# Patient Record
Sex: Female | Born: 1955 | Race: White | Hispanic: No | Marital: Married | Smoking: Never smoker
Health system: Southern US, Community
[De-identification: ages and names within clinical notes are randomized; demographics above are authoritative.]

## PROBLEM LIST (undated history)

## (undated) DIAGNOSIS — E785 Hyperlipidemia, unspecified: Secondary | ICD-10-CM

## (undated) DIAGNOSIS — N189 Chronic kidney disease, unspecified: Secondary | ICD-10-CM

## (undated) HISTORY — DX: Chronic kidney disease, unspecified: N18.9

## (undated) HISTORY — PX: CHOLECYSTECTOMY: SHX55

## (undated) HISTORY — PX: DENTAL SURGERY: SHX609

## (undated) HISTORY — PX: RENAL BIOPSY: SHX156

## (undated) HISTORY — DX: Hyperlipidemia, unspecified: E78.5

---

## 1997-02-20 ENCOUNTER — Ambulatory Visit (HOSPITAL_COMMUNITY): Admission: RE | Admit: 1997-02-20 | Discharge: 1997-02-20 | Payer: Self-pay | Admitting: Obstetrics and Gynecology

## 1997-04-13 ENCOUNTER — Ambulatory Visit (HOSPITAL_COMMUNITY): Admission: RE | Admit: 1997-04-13 | Discharge: 1997-04-13 | Payer: Self-pay | Admitting: Nephrology

## 1997-04-17 ENCOUNTER — Ambulatory Visit (HOSPITAL_COMMUNITY): Admission: RE | Admit: 1997-04-17 | Discharge: 1997-04-18 | Payer: Self-pay | Admitting: Nephrology

## 2001-12-22 ENCOUNTER — Other Ambulatory Visit: Admission: RE | Admit: 2001-12-22 | Discharge: 2001-12-22 | Payer: Self-pay | Admitting: Obstetrics and Gynecology

## 2002-10-14 ENCOUNTER — Ambulatory Visit (HOSPITAL_COMMUNITY): Admission: RE | Admit: 2002-10-14 | Discharge: 2002-10-14 | Payer: Self-pay | Admitting: Family Medicine

## 2002-10-14 ENCOUNTER — Encounter: Payer: Self-pay | Admitting: Family Medicine

## 2003-03-07 ENCOUNTER — Other Ambulatory Visit: Admission: RE | Admit: 2003-03-07 | Discharge: 2003-03-07 | Payer: Self-pay | Admitting: Obstetrics and Gynecology

## 2003-05-04 ENCOUNTER — Ambulatory Visit (HOSPITAL_COMMUNITY): Admission: RE | Admit: 2003-05-04 | Discharge: 2003-05-04 | Payer: Self-pay | Admitting: Obstetrics and Gynecology

## 2004-05-31 ENCOUNTER — Other Ambulatory Visit: Admission: RE | Admit: 2004-05-31 | Discharge: 2004-05-31 | Payer: Self-pay | Admitting: Obstetrics and Gynecology

## 2006-04-14 ENCOUNTER — Encounter: Admission: RE | Admit: 2006-04-14 | Discharge: 2006-04-14 | Payer: Self-pay | Admitting: Obstetrics and Gynecology

## 2011-01-09 ENCOUNTER — Other Ambulatory Visit (HOSPITAL_COMMUNITY)
Admission: RE | Admit: 2011-01-09 | Discharge: 2011-01-09 | Disposition: A | Discharge status: Home / Self Care | Payer: 59 | Source: Ambulatory Visit | Attending: Family Medicine | Admitting: Family Medicine

## 2011-01-09 DIAGNOSIS — Z0000 Encounter for general adult medical examination without abnormal findings: Secondary | ICD-10-CM | POA: Insufficient documentation

## 2011-01-10 ENCOUNTER — Other Ambulatory Visit: Payer: Self-pay | Admitting: Family Medicine

## 2011-12-16 ENCOUNTER — Other Ambulatory Visit: Payer: Self-pay | Admitting: Family Medicine

## 2011-12-16 DIAGNOSIS — Z1231 Encounter for screening mammogram for malignant neoplasm of breast: Secondary | ICD-10-CM

## 2012-01-19 ENCOUNTER — Ambulatory Visit
Admission: RE | Admit: 2012-01-19 | Discharge: 2012-01-19 | Disposition: A | Discharge status: Home / Self Care | Payer: 59 | Source: Ambulatory Visit | Attending: Family Medicine | Admitting: Family Medicine

## 2012-01-19 DIAGNOSIS — Z1231 Encounter for screening mammogram for malignant neoplasm of breast: Secondary | ICD-10-CM

## 2012-01-22 ENCOUNTER — Other Ambulatory Visit: Payer: Self-pay | Admitting: Family Medicine

## 2012-01-22 DIAGNOSIS — R928 Other abnormal and inconclusive findings on diagnostic imaging of breast: Secondary | ICD-10-CM

## 2012-02-16 ENCOUNTER — Ambulatory Visit
Admission: RE | Admit: 2012-02-16 | Discharge: 2012-02-16 | Disposition: A | Discharge status: Home / Self Care | Payer: 59 | Source: Ambulatory Visit | Attending: Family Medicine | Admitting: Family Medicine

## 2012-02-16 DIAGNOSIS — R928 Other abnormal and inconclusive findings on diagnostic imaging of breast: Secondary | ICD-10-CM

## 2013-04-04 ENCOUNTER — Ambulatory Visit (INDEPENDENT_AMBULATORY_CARE_PROVIDER_SITE_OTHER): Payer: 59

## 2013-04-04 ENCOUNTER — Encounter: Payer: Self-pay | Admitting: Podiatry

## 2013-04-04 ENCOUNTER — Ambulatory Visit (INDEPENDENT_AMBULATORY_CARE_PROVIDER_SITE_OTHER): Payer: 59 | Admitting: Podiatry

## 2013-04-04 VITALS — BP 122/76 | HR 93 | Resp 16 | Ht 65.5 in | Wt 195.0 lb

## 2013-04-04 DIAGNOSIS — M79609 Pain in unspecified limb: Secondary | ICD-10-CM

## 2013-04-04 DIAGNOSIS — M722 Plantar fascial fibromatosis: Secondary | ICD-10-CM

## 2013-04-04 MED ORDER — TRIAMCINOLONE ACETONIDE 10 MG/ML IJ SUSP
10.0000 mg | Freq: Once | INTRAMUSCULAR | Status: AC
  Administered 2013-04-04: 10 mg

## 2013-04-04 NOTE — Patient Instructions (Signed)

## 2013-04-04 NOTE — Progress Notes (Signed)
   Subjective:    Patient ID: Crystal Bruce, female    DOB: [birth year], 58 y.o.   MRN: [id number]  HPI N heel pain        L Left heel and inferior arch        D 03/31/2013        O after walking the dog, and housework        C shooting pain        A worse after rest and morning        T history of orthotic from [facility], and rest and Tylenol This patient walks 5 days a week 2 miles a day for approximately 8 years.  She is a history of right plantar fasciitis in July 2014 that responded to a Kenalog injection.  Review of Systems  Constitutional: Negative.   HENT: Negative.   Eyes: Negative.   Respiratory: Negative.   Cardiovascular: Negative.   Gastrointestinal: Negative.   Endocrine: Negative.   Genitourinary: Positive for dysuria.  Musculoskeletal: Negative.   Skin: Negative.   Allergic/Immunologic: Negative.   Neurological: Negative.   Hematological: Negative.   Psychiatric/Behavioral: Negative.        Objective:   Physical Exam  Orientated x2 white female  Vascular: DP and PT pulses 2/4 bilaterally  Neurological: Knee and ankle flex equal and reactive bilaterally  Dermatological: Texture and turgor within normal limits  Musculoskeletal palpable tenderness medial plantar left heel and medial navicular area. Patient has a limping gait favoring her left foot. Patient has an existing rigid functional orthotics are 2005 that contour well, which is where  X-ray left foot  Intact bony structure without any fracture and/or dislocation noted. Small inferior calcaneal spur noted  Radiographic impression: No acute bony abnormality noted      Assessment & Plan:   Assessment: Plantar fasciitis left  Plan: The skin is prepped with alcohol and Betadine and 10 mg of Kenalog mixed with 10 mg of plain Xylocaine and 2.5 mg of plain Marcaine are injected into the inferior heel left for Kenalog injection #1  Continue to wear custom orthotics  DC walking and substitute swimming  or exercise bike until fasciitis resolves Reappoint at patient's request

## 2013-04-05 ENCOUNTER — Encounter: Payer: Self-pay | Admitting: Podiatry

## 2013-06-03 ENCOUNTER — Emergency Department (HOSPITAL_COMMUNITY)
Admission: EM | Admit: 2013-06-03 | Discharge: 2013-06-03 | Disposition: A | Discharge status: Home / Self Care | Payer: 59 | Attending: Emergency Medicine | Admitting: Emergency Medicine

## 2013-06-03 ENCOUNTER — Encounter (HOSPITAL_COMMUNITY): Payer: Self-pay | Admitting: Emergency Medicine

## 2013-06-03 ENCOUNTER — Emergency Department (HOSPITAL_COMMUNITY): Payer: 59

## 2013-06-03 DIAGNOSIS — R110 Nausea: Secondary | ICD-10-CM | POA: Insufficient documentation

## 2013-06-03 DIAGNOSIS — N189 Chronic kidney disease, unspecified: Secondary | ICD-10-CM | POA: Insufficient documentation

## 2013-06-03 DIAGNOSIS — M546 Pain in thoracic spine: Secondary | ICD-10-CM | POA: Insufficient documentation

## 2013-06-03 DIAGNOSIS — K7689 Other specified diseases of liver: Secondary | ICD-10-CM | POA: Insufficient documentation

## 2013-06-03 DIAGNOSIS — R0602 Shortness of breath: Secondary | ICD-10-CM | POA: Insufficient documentation

## 2013-06-03 DIAGNOSIS — Z87448 Personal history of other diseases of urinary system: Secondary | ICD-10-CM | POA: Insufficient documentation

## 2013-06-03 DIAGNOSIS — Z79899 Other long term (current) drug therapy: Secondary | ICD-10-CM | POA: Insufficient documentation

## 2013-06-03 DIAGNOSIS — E785 Hyperlipidemia, unspecified: Secondary | ICD-10-CM | POA: Insufficient documentation

## 2013-06-03 DIAGNOSIS — R52 Pain, unspecified: Secondary | ICD-10-CM | POA: Insufficient documentation

## 2013-06-03 LAB — CBC
HEMATOCRIT: 39.5 % (ref 36.0–46.0)
Hemoglobin: 12.3 g/dL (ref 12.0–15.0)
MCH: 27.7 pg (ref 26.0–34.0)
MCHC: 31.1 g/dL (ref 30.0–36.0)
MCV: 89 fL (ref 78.0–100.0)
Platelets: 187 10*3/uL (ref 150–400)
RBC: 4.44 MIL/uL (ref 3.87–5.11)
RDW: 13.6 % (ref 11.5–15.5)
WBC: 6.8 10*3/uL (ref 4.0–10.5)

## 2013-06-03 LAB — BASIC METABOLIC PANEL
BUN: 21 mg/dL (ref 6–23)
CALCIUM: 9.6 mg/dL (ref 8.4–10.5)
CHLORIDE: 100 meq/L (ref 96–112)
CO2: 28 meq/L (ref 19–32)
CREATININE: 1.06 mg/dL (ref 0.50–1.10)
GFR calc Af Amer: 67 mL/min — ABNORMAL LOW (ref >90)
GFR calc non Af Amer: 58 mL/min — ABNORMAL LOW (ref >90)
GLUCOSE: 113 mg/dL — AB (ref 70–99)
Potassium: 4.5 mEq/L (ref 3.7–5.3)
Sodium: 140 mEq/L (ref 137–147)

## 2013-06-03 LAB — URINALYSIS, ROUTINE W REFLEX MICROSCOPIC
BILIRUBIN URINE: NEGATIVE
GLUCOSE, UA: NEGATIVE mg/dL
HGB URINE DIPSTICK: NEGATIVE
Ketones, ur: NEGATIVE mg/dL
Nitrite: NEGATIVE
PROTEIN: NEGATIVE mg/dL
Specific Gravity, Urine: 1.019 (ref 1.005–1.030)
Urobilinogen, UA: 0.2 mg/dL (ref 0.0–1.0)
pH: 6 (ref 5.0–8.0)

## 2013-06-03 LAB — COMPREHENSIVE METABOLIC PANEL
ALK PHOS: 78 U/L (ref 39–117)
ALT: 32 U/L (ref 0–35)
AST: 29 U/L (ref 0–37)
Albumin: 3.9 g/dL (ref 3.5–5.2)
BILIRUBIN TOTAL: 0.4 mg/dL (ref 0.3–1.2)
BUN: 19 mg/dL (ref 6–23)
CHLORIDE: 105 meq/L (ref 96–112)
CO2: 26 meq/L (ref 19–32)
Calcium: 9.5 mg/dL (ref 8.4–10.5)
Creatinine, Ser: 1.06 mg/dL (ref 0.50–1.10)
GFR calc non Af Amer: 58 mL/min — ABNORMAL LOW (ref >90)
GFR, EST AFRICAN AMERICAN: 67 mL/min — AB (ref >90)
GLUCOSE: 98 mg/dL (ref 70–99)
POTASSIUM: 4.3 meq/L (ref 3.7–5.3)
SODIUM: 145 meq/L (ref 137–147)
TOTAL PROTEIN: 7 g/dL (ref 6.0–8.3)

## 2013-06-03 LAB — URINE MICROSCOPIC-ADD ON

## 2013-06-03 LAB — I-STAT TROPONIN, ED: Troponin i, poc: 0 ng/mL (ref 0.00–0.08)

## 2013-06-03 LAB — PRO B NATRIURETIC PEPTIDE: Pro B Natriuretic peptide (BNP): 43.5 pg/mL (ref 0–125)

## 2013-06-03 LAB — LIPASE, BLOOD: Lipase: 26 U/L (ref 11–59)

## 2013-06-03 LAB — TROPONIN I: Troponin I: <0.3 ng/mL (ref <0.30)

## 2013-06-03 MED ORDER — IOHEXOL 350 MG/ML SOLN
100.0000 mL | Freq: Once | INTRAVENOUS | Status: AC | PRN
  Administered 2013-06-03: 100 mL via INTRAVENOUS

## 2013-06-03 MED ORDER — DIPHENHYDRAMINE HCL 50 MG/ML IJ SOLN
50.0000 mg | Freq: Once | INTRAMUSCULAR | Status: AC
  Administered 2013-06-03: 50 mg via INTRAVENOUS
  Filled 2013-06-03: qty 1

## 2013-06-03 MED ORDER — OXYCODONE-ACETAMINOPHEN 5-325 MG PO TABS: 1.0000 | ORAL_TABLET | ORAL | Status: AC | PRN

## 2013-06-03 MED ORDER — METHYLPREDNISOLONE SODIUM SUCC 125 MG IJ SOLR
125.0000 mg | Freq: Once | INTRAMUSCULAR | Status: AC
  Administered 2013-06-03: 125 mg via INTRAVENOUS
  Filled 2013-06-03: qty 2

## 2013-06-03 MED ORDER — SODIUM CHLORIDE 0.9 % IV BOLUS (SEPSIS)
1000.0000 mL | Freq: Once | INTRAVENOUS | Status: AC
  Administered 2013-06-03: 1000 mL via INTRAVENOUS

## 2013-06-03 MED ORDER — HYDROMORPHONE HCL PF 1 MG/ML IJ SOLN
1.0000 mg | Freq: Once | INTRAMUSCULAR | Status: AC
  Administered 2013-06-03: 1 mg via INTRAVENOUS
  Filled 2013-06-03: qty 1

## 2013-06-03 NOTE — ED Notes (Signed)
Pt reports sudden on set of sharp back pain between shoulder blades, nausea, shortness of breath approx 30 minutes ago. No heart hx.

## 2013-06-03 NOTE — Discharge Instructions (Signed)
The pain you are experiencing appears to be muscular in origin.  If your symptoms worsen such as worsening pain, fever, or vomiting, please return for further evaluation.

## 2013-06-03 NOTE — ED Provider Notes (Addendum)
Patient not in room.  Hilario Quarry, MD 06/03/13 1009  Hilario Quarry, MD 06/03/13 [phone]

## 2013-06-03 NOTE — ED Provider Notes (Signed)
CSN: [id number]     Arrival date & time 06/03/13  0844 History   First MD Initiated Contact with Patient 06/03/13 1001     Chief Complaint  Patient presents with  . Back Pain  . Shortness of Breath  . Nausea     (Consider location/radiation/quality/duration/timing/severity/associated sxs/prior Treatment) HPI 58 y.o female with right upper back pain below scapula which began this am and worsened.  She states it feels worth with deep breathing and movement.  The pain is sharp.  She has not had this pain in the past.  The pain has been ongoing for several hours.   She has some shortness of breath which appears to be associated with pain.  No cough, fever, or chill. No anterior pain and no radiation to arm, neck, or shoulder.  Past Medical History  Diagnosis Date  . Hyperlipidemia   . Chronic kidney disease     focal segmental glomerulosis schlerosis   Past Surgical History  Procedure Laterality Date  . Dental surgery    . Cholecystectomy    . Renal biopsy     Family History  Problem Relation Age of Onset  . Pulmonary embolism Mother   . Hyperlipidemia Father   . Cancer Father    History  Substance Use Topics  . Smoking status: Never Smoker   . Smokeless tobacco: Not on file  . Alcohol Use: No   OB History   Grav Para Term Preterm Abortions TAB SAB Ect Mult Living                 Review of Systems  All other systems reviewed and are negative.     Allergies  Ibuprofen; Ivp dye; and Naproxen  Home Medications   Prior to Admission medications   Medication Sig Start Date End Date Taking? Authorizing Provider  co-enzyme Q-10 30 MG capsule Take 30 mg by mouth daily.    Yes Historical Provider, MD  fexofenadine (ALLEGRA) 180 MG tablet Take 180 mg by mouth daily.   Yes Historical Provider, MD  losartan (COZAAR) 100 MG tablet Take 100 mg by mouth daily.   Yes Historical Provider, MD  Multiple Vitamin (MULTIVITAMIN) capsule Take 1 capsule by mouth daily.   Yes Historical  Provider, MD  Omega-3 Fatty Acids (FISH OIL) 1000 MG CAPS Take 1,000 mg by mouth daily.    Yes Historical Provider, MD  pravastatin (PRAVACHOL) 80 MG tablet Take 80 mg by mouth daily.   Yes Historical Provider, MD  oxyCODONE-acetaminophen (PERCOCET/ROXICET) 5-325 MG per tablet Take 1 tablet by mouth every 4 (four) hours as needed for severe pain. 06/03/13   Hilario Quarry, MD   BP 114/61  Pulse 73  Temp(Src) 97.8 F (36.6 C) (Oral)  Resp 15  SpO2 97% Physical Exam  Nursing note and vitals reviewed. Constitutional: She is oriented to person, place, and time. She appears well-developed and well-nourished.  HENT:  Head: Normocephalic and atraumatic.  Right Ear: External ear normal.  Left Ear: External ear normal.  Nose: Nose normal.  Mouth/Throat: Oropharynx is clear and moist.  Eyes: Conjunctivae and EOM are normal. Pupils are equal, round, and reactive to light.  Neck: Normal range of motion. Neck supple.  Cardiovascular: Normal rate, regular rhythm, normal heart sounds and intact distal pulses.   Pulmonary/Chest: Effort normal and breath sounds normal.  Abdominal: Soft. Bowel sounds are normal.  Musculoskeletal: Normal range of motion.  Right upper back ttp, no crepitus  Neurological: She is alert and oriented to  person, place, and time. She has normal reflexes.  Skin: Skin is warm and dry.  Psychiatric: She has a normal mood and affect. Her behavior is normal. Thought content normal.    ED Course  Procedures (including critical care time) Labs Review Labs Reviewed  BASIC METABOLIC PANEL - Abnormal; Notable for the following:    Glucose, Bld 113 (*)    GFR calc non Af Amer 58 (*)    GFR calc Af Amer 67 (*)    All other components within normal limits  COMPREHENSIVE METABOLIC PANEL - Abnormal; Notable for the following:    GFR calc non Af Amer 58 (*)    GFR calc Af Amer 67 (*)    All other components within normal limits  URINALYSIS, ROUTINE W REFLEX MICROSCOPIC - Abnormal;  Notable for the following:    Leukocytes, UA SMALL (*)    All other components within normal limits  CBC  PRO B NATRIURETIC PEPTIDE  TROPONIN I  LIPASE, BLOOD  URINE MICROSCOPIC-ADD ON  Rosezena Sensor, ED    Imaging Review Dg Chest 2 View  06/03/2013   CLINICAL DATA:  Right-sided pleuritic chest pain. Shortness of breath. Nausea.  EXAM: CHEST  2 VIEW  COMPARISON:  None.  FINDINGS: The heart size and mediastinal contours are within normal limits. Both lungs are clear. The visualized skeletal structures are unremarkable.  IMPRESSION: No active cardiopulmonary disease.   Electronically Signed   By: Myles Rosenthal M.D.   On: 06/03/2013 10:27   Ct Angio Chest Pe W/cm &/or Wo Cm  06/03/2013   CLINICAL DATA:  Short of breath and back pain between shoulder blades. Nausea. Pleuritic right upper back pain.  EXAM: CT ANGIOGRAPHY CHEST  CT ABDOMEN AND PELVIS WITH CONTRAST  TECHNIQUE: Multidetector CT imaging of the chest was performed using the standard protocol during bolus administration of intravenous contrast. Multiplanar CT image reconstructions and MIPs were obtained to evaluate the vascular anatomy. Multidetector CT imaging of the abdomen and pelvis was performed using the standard protocol during bolus administration of intravenous contrast.  CONTRAST:  OMNIPAQUE IOHEXOL 350 MG/ML SOLN  COMPARISON:  Plain films 06/03/2013 chest.  No prior CTs.  FINDINGS: CT CHEST FINDINGS  Lungs/Pleura: Minimal motion degradation. Bibasilar dependent subsegmental atelectasis with volume loss.  No pleural fluid.  Heart/Mediastinum: The quality of this examination for evaluation of pulmonary embolism is good. Bolus is well timed. No evidence of pulmonary embolism.  Normal aortic caliber without dissection.  Mild cardiomegaly, without pericardial effusion. No mediastinal or hilar adenopathy.  CT ABDOMEN AND PELVIS FINDINGS  Abdomen/Pelvis: Mild hepatic steatosis. No focal liver lesion. Normal spleen, stomach, pancreas.  Cholecystectomy, without biliary ductal dilatation. Normal adrenal glands. Mild renal cortical atrophy bilaterally. Bilateral extrarenal pelvis. Mild calyceal prominence is felt to be related to the extent of urinary bladder distention. No retroperitoneal or retrocrural adenopathy. Normal colon, appendix, and terminal ileum. Normal small bowel without abdominal ascites.  No pelvic adenopathy. Mild urinary bladder distention. Normal uterus, without adnexal mass or significant free pelvic fluid.  Bones/Musculoskeletal: No acute osseous abnormality.  Review of the MIP images confirms the above findings.  IMPRESSION: 1. No evidence of pulmonary embolism or aortic dissection. Minimal motion degradation within the chest. 2.  No acute process in the abdomen or pelvis. 3. Hepatic steatosis.   Electronically Signed   By: Jeronimo Greaves M.D.   On: 06/03/2013 14:10   Ct Abdomen Pelvis W Contrast  06/03/2013   CLINICAL DATA:  Short of breath  and back pain between shoulder blades. Nausea. Pleuritic right upper back pain.  EXAM: CT ANGIOGRAPHY CHEST  CT ABDOMEN AND PELVIS WITH CONTRAST  TECHNIQUE: Multidetector CT imaging of the chest was performed using the standard protocol during bolus administration of intravenous contrast. Multiplanar CT image reconstructions and MIPs were obtained to evaluate the vascular anatomy. Multidetector CT imaging of the abdomen and pelvis was performed using the standard protocol during bolus administration of intravenous contrast.  CONTRAST:  100mL OMNIPAQUE IOHEXOL 350 MG/ML SOLN  COMPARISON:  Plain films 06/03/2013 chest.  No prior CTs.  FINDINGS: CT CHEST FINDINGS  Lungs/Pleura: Minimal motion degradation. Bibasilar dependent subsegmental atelectasis with volume loss.  No pleural fluid.  Heart/Mediastinum: The quality of this examination for evaluation of pulmonary embolism is good. Bolus is well timed. No evidence of pulmonary embolism.  Normal aortic caliber without dissection.  Mild  cardiomegaly, without pericardial effusion. No mediastinal or hilar adenopathy.  CT ABDOMEN AND PELVIS FINDINGS  Abdomen/Pelvis: Mild hepatic steatosis. No focal liver lesion. Normal spleen, stomach, pancreas. Cholecystectomy, without biliary ductal dilatation. Normal adrenal glands. Mild renal cortical atrophy bilaterally. Bilateral extrarenal pelvis. Mild calyceal prominence is felt to be related to the extent of urinary bladder distention. No retroperitoneal or retrocrural adenopathy. Normal colon, appendix, and terminal ileum. Normal small bowel without abdominal ascites.  No pelvic adenopathy. Mild urinary bladder distention. Normal uterus, without adnexal mass or significant free pelvic fluid.  Bones/Musculoskeletal: No acute osseous abnormality.  Review of the MIP images confirms the above findings.  IMPRESSION: 1. No evidence of pulmonary embolism or aortic dissection. Minimal motion degradation within the chest. 2.  No acute process in the abdomen or pelvis. 3. Hepatic steatosis.   Electronically Signed   By: Jeronimo GreavesKyle  Talbot M.D.   On: 06/03/2013 14:10     EKG Interpretation   Date/Time:  Friday Jun 03 2013 08:51:08 EDT Ventricular Rate:  67 PR Interval:  124 QRS Duration: 72 QT Interval:  384 QTC Calculation: 405 R Axis:   42 Text Interpretation:  Normal sinus rhythm Nonspecific ST abnormality  Abnormal ECG Confirmed by Waymond Meador MD, Duwayne HeckANIELLE ([id number]) on 06/03/2013 3:55:39 PM      MDM   Final diagnoses:  Acute thoracic back pain    Patient with history of iv contrast reaction in her teens and renal insufficiency.  Patient with some ruq ttp and chest symptoms.  She was evaluated for cardiac etiology, pe, and great vessel disease.  CT angio and ct abdomen without acute abnormality.  Patient feels improved and given return precautions and advise regarding follow up.  She voices understanding.     Hilario Quarryanielle S Marlyn Rabine, MD 06/03/13 [phone]

## 2013-06-03 NOTE — ED Notes (Signed)
Pt denies complaints at this time. Pt alert x4 respirations easy non labored.

## 2013-06-03 NOTE — ED Notes (Signed)
Pt return from ct scan 

## 2013-10-18 ENCOUNTER — Other Ambulatory Visit (HOSPITAL_COMMUNITY)
Admission: RE | Admit: 2013-10-18 | Discharge: 2013-10-18 | Disposition: A | Discharge status: Home / Self Care | Payer: 59 | Source: Ambulatory Visit | Attending: Family Medicine | Admitting: Family Medicine

## 2013-10-18 ENCOUNTER — Other Ambulatory Visit: Payer: Self-pay | Admitting: Family Medicine

## 2013-10-18 DIAGNOSIS — Z124 Encounter for screening for malignant neoplasm of cervix: Secondary | ICD-10-CM | POA: Diagnosis present

## 2013-10-20 LAB — CYTOLOGY - PAP

## 2015-07-17 ENCOUNTER — Other Ambulatory Visit: Payer: Self-pay | Admitting: Family Medicine

## 2015-07-17 DIAGNOSIS — Z1231 Encounter for screening mammogram for malignant neoplasm of breast: Secondary | ICD-10-CM

## 2015-07-26 ENCOUNTER — Ambulatory Visit
Admission: RE | Admit: 2015-07-26 | Discharge: 2015-07-26 | Disposition: A | Discharge status: Home / Self Care | Payer: 59 | Source: Ambulatory Visit | Attending: Family Medicine | Admitting: Family Medicine

## 2015-07-26 DIAGNOSIS — Z1231 Encounter for screening mammogram for malignant neoplasm of breast: Secondary | ICD-10-CM

## 2016-07-25 ENCOUNTER — Other Ambulatory Visit: Payer: Self-pay | Admitting: Family Medicine

## 2016-07-25 ENCOUNTER — Other Ambulatory Visit (HOSPITAL_COMMUNITY)
Admission: RE | Admit: 2016-07-25 | Discharge: 2016-07-25 | Disposition: A | Discharge status: Home / Self Care | Payer: BLUE CROSS/BLUE SHIELD | Source: Ambulatory Visit | Attending: Family Medicine | Admitting: Family Medicine

## 2016-07-25 DIAGNOSIS — Z124 Encounter for screening for malignant neoplasm of cervix: Secondary | ICD-10-CM | POA: Diagnosis present

## 2016-07-28 LAB — CYTOLOGY - PAP: Diagnosis: NEGATIVE

## 2016-10-31 ENCOUNTER — Other Ambulatory Visit: Payer: Self-pay | Admitting: Family Medicine

## 2016-10-31 DIAGNOSIS — Z1231 Encounter for screening mammogram for malignant neoplasm of breast: Secondary | ICD-10-CM

## 2016-11-03 ENCOUNTER — Ambulatory Visit
Admission: RE | Admit: 2016-11-03 | Discharge: 2016-11-03 | Disposition: A | Discharge status: Home / Self Care | Payer: BLUE CROSS/BLUE SHIELD | Source: Ambulatory Visit | Attending: Family Medicine | Admitting: Family Medicine

## 2016-11-03 DIAGNOSIS — Z1231 Encounter for screening mammogram for malignant neoplasm of breast: Secondary | ICD-10-CM

## 2016-11-05 NOTE — Progress Notes (Signed)
[facility]   Patient Details  Name: Crystal BouquetSusan Buchheit MRN: [id number] Date of Birth: May 08, 1955 Age: 61 y.o. PCP: Laurann MontanaWhite, Cynthia, MD  Vitals:   11/05/16 1329  BP: 120/78  Pulse: 78  Resp: 18  SpO2: 98%  Weight: 195 lb 12.8 oz (88.8 kg)         [facility] Eval - 11/05/16 1300      Referral    Referring Provider self   Reason for referral Obesitity/Overweight;High Cholesterol;Orthopedic   Program Start Date 11/05/16     Measurement   Waist Circumference 42 inches   Hip Circumference 48 inches   Body fat 45.1 percent     Information for Trainer   Goals "stability/balance, endurance,weight loss, to enjoy exercising"   Current Exercise "walk dog, fitboard"(I typically hit my 10,000step goal"   Orthopedic Concerns Back issues over past 20 yrs, knees occasionally"talk" to me"   Pertinent Medical History back   Current Barriers none     Timed Up and Go (TUGS)   Timed Up and Go Low risk <9 seconds  7.42     Mobility and Daily Activities   I find it easy to walk up or down two or more flights of stairs. 3   I have no trouble taking out the trash. 4   I do housework such as vacuuming and dusting on my own without difficulty. 3   I can easily lift a gallon of milk (8lbs). 4   I can easily walk a mile. 4   I have no trouble reaching into high cupboards or reaching down to pick up something from the floor. 3   I do not have trouble doing out-door work such as Loss adjuster, charteredmoving the lawn, raking leaves, or gardening. 3     Mobility and Daily Activities   I feel younger than my age. 2   I feel independent. 4   I feel energetic. 3   I live an active life.  2   I feel strong. 2   I feel healthy. 2   I feel active as other people my age. 2     How fit and strong are you.   Fit and Strong Total Score 41     Past Medical History:  Diagnosis Date  . Chronic kidney disease    focal segmental glomerulosis schlerosis  . Hyperlipidemia    Past Surgical History:   Procedure Laterality Date  . CHOLECYSTECTOMY    . DENTAL SURGERY    . RENAL BIOPSY     History  Smoking Status  . Never Smoker  Smokeless Tobacco  . Not on file     Ms. Tillman AbideSlagle has registered for the PREP and attended her first weekly class today.  She is motivated to make healthy lifestyle change.   Rose FillersDebbie Texie Tupou 11/05/2016, 1:34 PM

## 2016-11-14 NOTE — Progress Notes (Signed)
[facility]   Patient Details  Name: Crystal Bruce MRN: [id number] Date of Birth: 12-11-1955 Age: 61 y.o. PCP: Laurann MontanaWhite, Cynthia, MD  Vitals:   11/14/16 1331  Weight: 194 lb 6.4 oz (88.2 kg)    [facility] Weekly seesion - 11/14/16 1300      Weekly Session   Topic Discussed  Water    Minutes exercised this week  -- 15 min q/am flexibility & about 10, 000 steps Q day   15 min q/am flexibility & about 10, 000 steps Q day   Classes attended to date  2      Fun things you did since last meeting:"reconnected w/several friends" Things you are grateful for:"family" Nutrition celebrations:"didn't eat a lot of halloween candy" Barriers:"worked on removing barriers this week.  Cleaned out pantry and fridge"  Rose FillersDebbie Jaziyah Gradel 11/14/2016, 1:33 PM

## 2016-12-05 NOTE — Progress Notes (Signed)
[facility]   Patient Details  Name: Crystal BouquetSusan Shima MRN: [id number] Date of Birth: June 27, 1955 Age: 61 y.o. PCP: Laurann MontanaWhite, Cynthia, MD  Vitals:   11/19/16 1213  Weight: 193 lb 6.4 oz (87.7 kg)    [facility] Weekly seesion - 12/05/16 1200      Weekly Session   Topic Discussed  Eating for the season    Minutes exercised this week  0 minutes    Classes attended to date  3      Fun things you did this week:"[place]" Things you are grateful for:"Family-Jim continues to be a trememdous help w/mom" Nutrition celebrations:"this was my 4th week of weight waters-total lost 3.8lbs" Barriers:"back issues"   Rose FillersDebbie Raynaldo Falco 12/05/2016, 12:13 PM

## 2016-12-05 NOTE — Progress Notes (Signed)
[facility]   Patient Details  Name: Crystal BouquetSusan Bruce MRN: [id number] Date of Birth: 08/24/1955 Age: 61 y.o. PCP: Laurann MontanaWhite, Cynthia, MD  Vitals:   12/03/16 1248  Weight: 193 lb (87.5 kg)    [facility] Weekly seesion - 12/05/16 1248      Weekly Session   Topic Discussed  Other portions   portions   Minutes exercised this week  0 minutes "starting w/trainer next week"   "starting w/trainer next week"   Classes attended to date  4      Fun things you did last week:"hanging out /wfamily" Things you are grateful for:"heat" Nutrition celebrations:"didn't go back for seconds at Thanksgiving" Barriers:"[place] [facility] but parking lot was so congested I left"  Rose FillersDebbie Ardyn Forge 12/05/2016, 12:49 PM

## 2016-12-12 NOTE — Progress Notes (Signed)
[facility]   Patient Details  Name: Virgel BouquetSusan Backlund MRN: [id number] Date of Birth: 1955-11-04 Age: 61 y.o. PCP: Laurann MontanaWhite, Cynthia, MD  Vitals:   12/10/16 1237  Weight: 189 lb 12.8 oz (86.1 kg)    [facility] Weekly seesion - 12/12/16 1200      Weekly Session   Topic Discussed  Hitting roadblocks    Minutes exercised this week  30 minutes cardio   cardio   Classes attended to date  5      Fun things you did since last meeting:"Knit-almost finished a sweater" Things you are grateful for:"Health care for now" Nutrition celebrations for the week:"Tracked food-stayed w/i points range" Barriers:"myself"  Rose FillersDebbie Besan Ketchem 12/12/2016, 12:37 PM

## 2016-12-26 NOTE — Progress Notes (Signed)
[facility]   Patient Details  Name: Virgel BouquetSusan Fenstermaker MRN: [id number] Date of Birth: January 02, 1956 Age: 61 y.o. PCP: Laurann MontanaWhite, Cynthia, MD  Vitals:   12/26/16 1051  Weight: 188 lb 6.4 oz (85.5 kg)    [facility] Weekly seesion - 12/26/16 1000      Weekly Session   Topic Discussed  Expectations and non-scale victories    Minutes exercised this week  60 minutes 30cardio/30strength   30cardio/30strength   Classes attended to date  6      Fun things you did since last meeting:"picked up the boy from [employer]" Things you are grateful for:"having Daylon home also having my brother take mom to dr. Burgess EstelleYesterday." Nutrition celebrations:"continued to track" Barriers:"mom w/her dementia"   Rose FillersDebbie Romaine Maciolek 12/26/2016, 10:59 AM

## 2017-01-09 NOTE — Progress Notes (Signed)
[facility]   Patient Details  Name: Crystal BouquetSusan Bruce MRN: [id number] Date of Birth: 04-08-1955 Age: 62 y.o. PCP: Laurann MontanaWhite, Cynthia, MD  Vitals:   01/07/17 0957  Weight: 186 lb 9.6 oz (84.6 kg)    [facility] Weekly seesion - 01/09/17 0900      Weekly Session   Topic Discussed  Restaurant Eating    Minutes exercised this week  75 minutes "30+cardio/45+strength"   "30+cardio/45+strength"   Classes attended to date  7      Fun things you did since last meeting:"reconnected w/girlfriends on 1/1" Things you are grateful for:"family" Nutrition celebrations for the week""didn't go junk food crazy over the holidays" Barriers:"myself"   Rose FillersDebbie Maleigh Bagot 01/09/2017, 10:10 AM

## 2017-01-19 NOTE — Progress Notes (Signed)
[facility]   Patient Details  Name: Virgel BouquetSusan Tapscott MRN: [id number] Date of Birth: 1955/02/19 Age: 62 y.o. PCP: Laurann MontanaWhite, Cynthia, MD  Vitals:   01/19/17 [id number]  Weight: 188 lb 12.8 oz (85.6 kg)    [facility] Weekly seesion - 01/19/17 0900      Weekly Session   Topic Discussed  Stress management and problem solving    Minutes exercised this week  60 minutes 30cardio/30strength   30cardio/30strength   Classes attended to date  8      Fun things you did since last meeting:"read"   Rose FillersDebbie Honest Safranek 01/19/2017, 9:24 AM

## 2017-01-26 NOTE — Progress Notes (Signed)
[facility]   Patient Details  Name: Crystal BouquetSusan Bruce MRN: [id number] Date of Birth: [birth year] Age: 62 y.o. PCP: Laurann MontanaWhite, Cynthia, MD  Vitals:   01/21/17 1127  Weight: 190 lb 9.6 oz (86.5 kg)    [facility] Weekly seesion - 01/26/17 1100      Weekly Session   Topic Discussed  Importance of resistance training    Minutes exercised this week  150 minutes 90cardio/30strength   90cardio/30strength   Classes attended to date  569      Fun things you did since last meeting:"Had some friends over to watch Donnella BiMama Mia, Here We Go Again" Things you are grateful for:"Friends" Nutrition celebrations:"All junk is out of the house.  Back to planning healthier choices" Barriers:"Demanding Mother"   Rose FillersDebbie Inari Shin 01/26/2017, 11:29 AM

## 2017-02-04 NOTE — Progress Notes (Signed)
[facility] PRE[facility] Weekly Session   Patient Details  Name: Crystal Bruce MRN: [id number] Date of Birth: 01/28/1955 Age: 62 y.o. PCP: Harlan Stains, MD  Vitals:   02/04/17 1257  Weight: 189 lb 8 oz (86 kg)    [facility] Weekly seesion - 02/04/17 1200      Weekly Session   Topic Discussed  Other portion control   portion control   Minutes exercised this week  -- "cardio"   "cardio"   Classes attended to date  36      Fun things you did since last meeting:"Met w/knitting group and went to engagement party" Things you are grateful for:"warm jackets and scarves" Nutrition celebrations:"tracked food intake" Barriers:" fell last Friday when carrying dog down steps. Big toe is still swollen, bruised and tender"   [name] 02/04/2017, 12:58 PM

## 2017-02-04 NOTE — Progress Notes (Signed)
[facility]   Patient Details  Name: Crystal BouquetSusan Bruce MRN: [id number] Date of Birth: 05-22-1955 Age: 62 y.o. PCP: Laurann MontanaWhite, Cynthia, MD  Vitals:   01/28/17 0913  Weight: 189 lb 9.6 oz (86 kg)    [facility] Weekly seesion - 02/04/17 0900      Weekly Session   Topic Discussed  Finding support    Minutes exercised this week  120 minutes 60cardio/60strength + home workouts   60cardio/60strength + home workouts   Classes attended to date  10      Accomplishments since last meeting:"1)took down tree and put decorations away" 2)Go Jim to vaccuum while I put stuff away" Things you are grateful for:"celebrated brothers, B-day @ my house" Nutrition celebrations:"Fed B-day guests healthy choices (chicken picante, squash, carrots, rice)" Barriers:"Clutter. Next Goal!  Get Rosanne AshingJim to empty canister after he vaccuums"  Rose FillersDebbie Aerianna Losey 02/04/2017, 9:14 AM

## 2017-02-13 NOTE — Progress Notes (Signed)
[facility]   Patient Details  Name: Crystal BouquetSusan Bruce MRN: [id number] Date of Birth: [birth year] Age: 62 y.o. PCP: Laurann MontanaWhite, Cynthia, MD  Vitals:   02/11/17 1349  Weight: 189 lb (85.7 kg)    [facility] Weekly seesion - 02/13/17 1300      Weekly Session   Topic Discussed  Other Guest speaker   Guest speaker   Classes attended to date  4212      Fun things you did since last meeting:"Encourated dog to do our old walking route" Things you are grateful for:"Warm weather and fitting in size 16 jeans without laying down to zip"  Rose FillersDebbie Aleaha Fickling 02/13/2017, 1:49 PM

## 2017-02-20 NOTE — Progress Notes (Signed)
[facility]   Patient Details  Name: Crystal Bruce MRN: [id number] Date of Birth: 11-Sep-1955 Age: 62 y.o. PCP: Laurann MontanaWhite, Cynthia, MD  Vitals:   02/18/17 1034  Weight: 186 lb 14.4 oz (84.8 kg)    [facility] Weekly seesion - 02/20/17 1000      Weekly Session   Topic Discussed  Healthy eating tips    Classes attended to date  9313      Fun things you did since last meeting:"Lunch w/friends" Things you are grateful for:"this program" Nutrition celebrations:"Tracked daily"  Rose FillersDebbie Morine Kohlman 02/20/2017, 10:35 AM

## 2017-03-02 NOTE — Progress Notes (Signed)
[facility] PRE[facility] Progress Report   Patient Details  Name: Crystal Bruce MRN: [id number] Date of Birth: 03/09/1955 Age: 62 y.o. PCP: Crystal Bruce, Cynthia, MD  Vitals:   03/02/17 1346  BP: 110/74  Pulse: 77  Resp: 18  SpO2: 97%  Weight: 188 lb 9.6 oz (85.5 kg)     Crystal Bruce - 03/02/17 1300      Measurement   Waist Circumference  41 inches    Hip Circumference  44 inches      Mobility and Daily Activities   I find it easy to walk up or down two or more flights of stairs.  3    I have no trouble taking out the trash.  4    I do housework such as vacuuming and dusting on my own without difficulty.  4    I can easily lift a gallon of milk (8lbs).  4    I can easily walk a mile.  4    I have no trouble reaching into high cupboards or reaching down to pick up something from the floor.  4    I do not have trouble doing out-door work such as Loss adjuster, charteredmoving the lawn, raking leaves, or gardening.  4      Mobility and Daily Activities   I feel younger than my age.  3    I feel independent.  4    I feel energetic.  3    I live an active life.   3    I feel strong.  4    I feel healthy.  4    I feel active as other people my age.  4      How fit and strong are you.   Fit and Strong Total Score  52      Past Medical History:  Diagnosis Date  . Chronic kidney disease    focal segmental glomerulosis schlerosis  . Hyperlipidemia    Past Surgical History:  Procedure Laterality Date  . CHOLECYSTECTOMY    . DENTAL SURGERY    . RENAL BIOPSY     Social History   Tobacco Use  Smoking Status Never Smoker    Some of Crystal Bruce's goals for the program when she started were to build stability, balance, and endurance. She also wanted to lose weight and enjoy exercising.  She states she has much better balance and stability and enjoys her exercise routine.  She would like to increase her cardio exercise to increase her endurance, and plans to do that.  She states she is more active, more often  throughout her day, whereas previously she'd reach her [employer] step goal then sit around the rest of the day.  She also states her knees don't seem to bother her as much as they used to and that she recovers much better than she used to.  She doesn't need to use her hands to get up from a chair and can get up off the floor much easier than prior to starting the program.   Through the program and her enrollment with [employer], Crystal Bruce has decreased her weight, BMI, and Body Fat % and increased her fitness level.  She plans to continue to exercise, and in fact, states she'd like to add an extra workout day a week to her current routine.    Crystal FillersDebbie Crystal Bruce 03/02/2017, 1:47 PM

## 2017-03-30 ENCOUNTER — Ambulatory Visit (INDEPENDENT_AMBULATORY_CARE_PROVIDER_SITE_OTHER): Payer: BLUE CROSS/BLUE SHIELD | Admitting: Licensed Clinical Social Worker

## 2017-03-30 DIAGNOSIS — F321 Major depressive disorder, single episode, moderate: Secondary | ICD-10-CM

## 2017-04-09 ENCOUNTER — Ambulatory Visit (INDEPENDENT_AMBULATORY_CARE_PROVIDER_SITE_OTHER): Payer: BLUE CROSS/BLUE SHIELD | Admitting: Licensed Clinical Social Worker

## 2017-04-09 DIAGNOSIS — F3341 Major depressive disorder, recurrent, in partial remission: Secondary | ICD-10-CM | POA: Diagnosis not present

## 2017-05-05 ENCOUNTER — Ambulatory Visit (INDEPENDENT_AMBULATORY_CARE_PROVIDER_SITE_OTHER): Payer: BLUE CROSS/BLUE SHIELD | Admitting: Licensed Clinical Social Worker

## 2017-05-05 DIAGNOSIS — F3341 Major depressive disorder, recurrent, in partial remission: Secondary | ICD-10-CM

## 2017-05-19 ENCOUNTER — Ambulatory Visit (INDEPENDENT_AMBULATORY_CARE_PROVIDER_SITE_OTHER): Payer: BLUE CROSS/BLUE SHIELD | Admitting: Licensed Clinical Social Worker

## 2017-05-19 DIAGNOSIS — F3341 Major depressive disorder, recurrent, in partial remission: Secondary | ICD-10-CM

## 2017-06-02 ENCOUNTER — Ambulatory Visit (INDEPENDENT_AMBULATORY_CARE_PROVIDER_SITE_OTHER): Payer: BLUE CROSS/BLUE SHIELD | Admitting: Licensed Clinical Social Worker

## 2017-06-02 DIAGNOSIS — F3341 Major depressive disorder, recurrent, in partial remission: Secondary | ICD-10-CM

## 2017-06-16 ENCOUNTER — Ambulatory Visit: Payer: BLUE CROSS/BLUE SHIELD | Admitting: Licensed Clinical Social Worker

## 2017-06-30 ENCOUNTER — Ambulatory Visit (INDEPENDENT_AMBULATORY_CARE_PROVIDER_SITE_OTHER): Payer: BLUE CROSS/BLUE SHIELD | Admitting: Licensed Clinical Social Worker

## 2017-06-30 DIAGNOSIS — F3341 Major depressive disorder, recurrent, in partial remission: Secondary | ICD-10-CM | POA: Diagnosis not present

## 2017-07-27 ENCOUNTER — Ambulatory Visit (INDEPENDENT_AMBULATORY_CARE_PROVIDER_SITE_OTHER): Payer: BLUE CROSS/BLUE SHIELD | Admitting: Licensed Clinical Social Worker

## 2017-07-27 DIAGNOSIS — F3341 Major depressive disorder, recurrent, in partial remission: Secondary | ICD-10-CM | POA: Diagnosis not present

## 2017-08-31 ENCOUNTER — Ambulatory Visit (INDEPENDENT_AMBULATORY_CARE_PROVIDER_SITE_OTHER): Payer: BLUE CROSS/BLUE SHIELD | Admitting: Licensed Clinical Social Worker

## 2017-08-31 DIAGNOSIS — F3341 Major depressive disorder, recurrent, in partial remission: Secondary | ICD-10-CM | POA: Diagnosis not present

## 2017-09-28 ENCOUNTER — Ambulatory Visit (INDEPENDENT_AMBULATORY_CARE_PROVIDER_SITE_OTHER): Payer: BLUE CROSS/BLUE SHIELD | Admitting: Licensed Clinical Social Worker

## 2017-09-28 DIAGNOSIS — F3341 Major depressive disorder, recurrent, in partial remission: Secondary | ICD-10-CM | POA: Diagnosis not present

## 2017-11-02 ENCOUNTER — Ambulatory Visit (INDEPENDENT_AMBULATORY_CARE_PROVIDER_SITE_OTHER): Payer: BLUE CROSS/BLUE SHIELD | Admitting: Licensed Clinical Social Worker

## 2017-11-02 ENCOUNTER — Ambulatory Visit: Payer: BLUE CROSS/BLUE SHIELD | Admitting: Licensed Clinical Social Worker

## 2017-11-02 DIAGNOSIS — F3341 Major depressive disorder, recurrent, in partial remission: Secondary | ICD-10-CM | POA: Diagnosis not present

## 2017-12-09 ENCOUNTER — Ambulatory Visit: Payer: BLUE CROSS/BLUE SHIELD | Admitting: Licensed Clinical Social Worker

## 2017-12-09 DIAGNOSIS — F3341 Major depressive disorder, recurrent, in partial remission: Secondary | ICD-10-CM

## 2018-02-10 ENCOUNTER — Ambulatory Visit: Payer: BLUE CROSS/BLUE SHIELD | Admitting: Licensed Clinical Social Worker

## 2019-08-19 ENCOUNTER — Other Ambulatory Visit: Payer: Self-pay | Admitting: Family Medicine

## 2019-08-19 DIAGNOSIS — Z1231 Encounter for screening mammogram for malignant neoplasm of breast: Secondary | ICD-10-CM

## 2019-09-07 ENCOUNTER — Ambulatory Visit
Admission: RE | Admit: 2019-09-07 | Discharge: 2019-09-07 | Disposition: A | Discharge status: Home / Self Care | Payer: 59 | Source: Ambulatory Visit | Attending: Family Medicine | Admitting: Family Medicine

## 2019-09-07 ENCOUNTER — Other Ambulatory Visit: Payer: Self-pay

## 2019-09-07 DIAGNOSIS — Z1231 Encounter for screening mammogram for malignant neoplasm of breast: Secondary | ICD-10-CM

## 2019-09-13 ENCOUNTER — Other Ambulatory Visit: Payer: Self-pay | Admitting: Family Medicine

## 2019-09-13 DIAGNOSIS — R928 Other abnormal and inconclusive findings on diagnostic imaging of breast: Secondary | ICD-10-CM

## 2019-09-19 ENCOUNTER — Ambulatory Visit
Admission: RE | Admit: 2019-09-19 | Discharge: 2019-09-19 | Disposition: A | Discharge status: Home / Self Care | Payer: 59 | Source: Ambulatory Visit | Attending: Family Medicine | Admitting: Family Medicine

## 2019-09-19 ENCOUNTER — Other Ambulatory Visit: Payer: Self-pay

## 2019-09-19 DIAGNOSIS — R928 Other abnormal and inconclusive findings on diagnostic imaging of breast: Secondary | ICD-10-CM

## 2019-10-11 ENCOUNTER — Other Ambulatory Visit: Payer: Self-pay | Admitting: Family Medicine

## 2019-10-11 ENCOUNTER — Other Ambulatory Visit (HOSPITAL_COMMUNITY)
Admission: RE | Admit: 2019-10-11 | Discharge: 2019-10-11 | Disposition: A | Discharge status: Home / Self Care | Payer: 59 | Source: Ambulatory Visit | Attending: Family Medicine | Admitting: Family Medicine

## 2019-10-11 DIAGNOSIS — Z124 Encounter for screening for malignant neoplasm of cervix: Secondary | ICD-10-CM | POA: Diagnosis present

## 2019-10-14 LAB — CYTOLOGY - PAP
Comment: NEGATIVE
Diagnosis: NEGATIVE
High risk HPV: NEGATIVE

## 2020-02-22 ENCOUNTER — Ambulatory Visit (INDEPENDENT_AMBULATORY_CARE_PROVIDER_SITE_OTHER): Payer: 59 | Admitting: Psychology

## 2020-02-22 DIAGNOSIS — F331 Major depressive disorder, recurrent, moderate: Secondary | ICD-10-CM

## 2020-03-06 ENCOUNTER — Ambulatory Visit (INDEPENDENT_AMBULATORY_CARE_PROVIDER_SITE_OTHER): Payer: 59 | Admitting: Psychology

## 2020-03-06 DIAGNOSIS — F331 Major depressive disorder, recurrent, moderate: Secondary | ICD-10-CM | POA: Diagnosis not present

## 2020-03-14 ENCOUNTER — Ambulatory Visit (INDEPENDENT_AMBULATORY_CARE_PROVIDER_SITE_OTHER): Payer: 59 | Admitting: Psychology

## 2020-03-14 DIAGNOSIS — F331 Major depressive disorder, recurrent, moderate: Secondary | ICD-10-CM | POA: Diagnosis not present

## 2020-03-21 ENCOUNTER — Ambulatory Visit: Payer: 59 | Admitting: Psychology

## 2020-04-04 ENCOUNTER — Ambulatory Visit (INDEPENDENT_AMBULATORY_CARE_PROVIDER_SITE_OTHER): Payer: 59 | Admitting: Psychology

## 2020-04-04 DIAGNOSIS — F331 Major depressive disorder, recurrent, moderate: Secondary | ICD-10-CM | POA: Diagnosis not present

## 2020-04-18 ENCOUNTER — Ambulatory Visit: Payer: 59 | Admitting: Psychology

## 2020-05-02 ENCOUNTER — Ambulatory Visit (INDEPENDENT_AMBULATORY_CARE_PROVIDER_SITE_OTHER): Payer: 59 | Admitting: Psychology

## 2020-05-02 DIAGNOSIS — F331 Major depressive disorder, recurrent, moderate: Secondary | ICD-10-CM | POA: Diagnosis not present

## 2020-05-16 ENCOUNTER — Ambulatory Visit (INDEPENDENT_AMBULATORY_CARE_PROVIDER_SITE_OTHER): Payer: 59 | Admitting: Psychology

## 2020-05-16 DIAGNOSIS — F331 Major depressive disorder, recurrent, moderate: Secondary | ICD-10-CM

## 2020-05-30 ENCOUNTER — Ambulatory Visit (INDEPENDENT_AMBULATORY_CARE_PROVIDER_SITE_OTHER): Payer: 59 | Admitting: Psychology

## 2020-05-30 DIAGNOSIS — F331 Major depressive disorder, recurrent, moderate: Secondary | ICD-10-CM

## 2020-06-13 ENCOUNTER — Ambulatory Visit (INDEPENDENT_AMBULATORY_CARE_PROVIDER_SITE_OTHER): Payer: 59 | Admitting: Psychology

## 2020-06-13 DIAGNOSIS — F331 Major depressive disorder, recurrent, moderate: Secondary | ICD-10-CM | POA: Diagnosis not present

## 2020-06-20 ENCOUNTER — Ambulatory Visit: Payer: 59 | Attending: Critical Care Medicine

## 2020-06-20 ENCOUNTER — Other Ambulatory Visit: Payer: Self-pay

## 2020-06-20 DIAGNOSIS — Z23 Encounter for immunization: Secondary | ICD-10-CM

## 2020-06-20 NOTE — Progress Notes (Signed)
   Covid-19 Vaccination Clinic  Name:  Crystal Bruce    MRN: [id number] DOB: 1955-11-23  06/20/2020  [name] was observed post Covid-19 immunization for 15 minutes without incident. She was provided with Vaccine Information Sheet and instruction to access the V-Safe system.   [name] was instructed to call 911 with any severe reactions post vaccine: Difficulty breathing  Swelling of face and throat  A fast heartbeat  A bad rash all over body  Dizziness and weakness   Immunizations Administered     Name Date Dose VIS Date Route   PFIZER Comrnaty(Gray TOP) Covid-19 Vaccine 06/20/2020 10:24 AM 0.3 mL 12/15/2019 Intramuscular   Manufacturer: [employer], [employer]   Lot: [id number]   NDC: [phone]

## 2020-06-27 ENCOUNTER — Ambulatory Visit (INDEPENDENT_AMBULATORY_CARE_PROVIDER_SITE_OTHER): Payer: 59 | Admitting: Psychology

## 2020-06-27 DIAGNOSIS — F331 Major depressive disorder, recurrent, moderate: Secondary | ICD-10-CM | POA: Diagnosis not present

## 2020-07-11 ENCOUNTER — Ambulatory Visit (INDEPENDENT_AMBULATORY_CARE_PROVIDER_SITE_OTHER): Payer: 59 | Admitting: Psychology

## 2020-07-11 DIAGNOSIS — F331 Major depressive disorder, recurrent, moderate: Secondary | ICD-10-CM

## 2020-09-11 ENCOUNTER — Ambulatory Visit (INDEPENDENT_AMBULATORY_CARE_PROVIDER_SITE_OTHER): Payer: 59 | Admitting: Psychologist

## 2020-09-11 DIAGNOSIS — F330 Major depressive disorder, recurrent, mild: Secondary | ICD-10-CM | POA: Diagnosis not present

## 2020-09-25 ENCOUNTER — Ambulatory Visit (INDEPENDENT_AMBULATORY_CARE_PROVIDER_SITE_OTHER): Payer: 59 | Admitting: Psychologist

## 2020-09-25 DIAGNOSIS — F330 Major depressive disorder, recurrent, mild: Secondary | ICD-10-CM

## 2020-11-13 ENCOUNTER — Other Ambulatory Visit: Payer: Self-pay | Admitting: Family Medicine

## 2020-11-13 DIAGNOSIS — Z1231 Encounter for screening mammogram for malignant neoplasm of breast: Secondary | ICD-10-CM

## 2020-11-17 ENCOUNTER — Ambulatory Visit
Admission: RE | Admit: 2020-11-17 | Discharge: 2020-11-17 | Disposition: A | Discharge status: Home / Self Care | Payer: 59 | Source: Ambulatory Visit | Attending: Family Medicine | Admitting: Family Medicine

## 2020-11-17 ENCOUNTER — Other Ambulatory Visit: Payer: Self-pay

## 2020-11-17 DIAGNOSIS — Z1231 Encounter for screening mammogram for malignant neoplasm of breast: Secondary | ICD-10-CM

## 2020-12-17 ENCOUNTER — Ambulatory Visit: Payer: 59 | Admitting: Psychologist

## 2022-06-03 IMAGING — MG MM DIGITAL SCREENING BILAT W/ TOMO AND CAD
8 series · 8 of 24 positions shown · non-contrast
Comparison: Previous exam(s).

CLINICAL DATA: Screening.

EXAM:
DIGITAL SCREENING BILATERAL MAMMOGRAM WITH TOMOSYNTHESIS AND CAD
TECHNIQUE: Bilateral screening digital craniocaudal and mediolateral oblique
mammograms were obtained. Bilateral screening digital breast
tomosynthesis was performed. The images were evaluated with
computer-aided detection.

[L CC synth-2D]
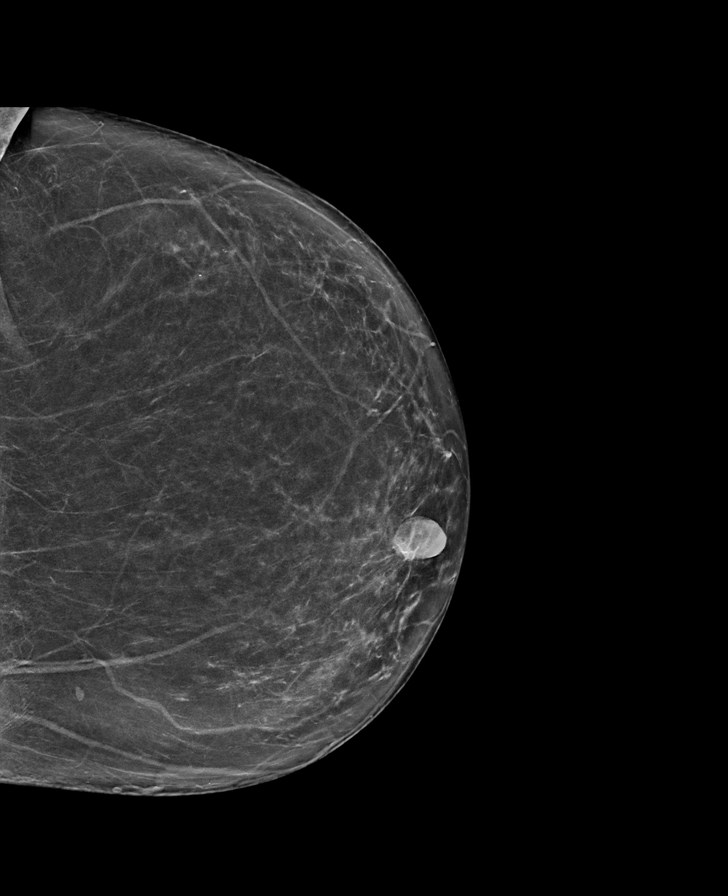

[L MLO synth-2D]
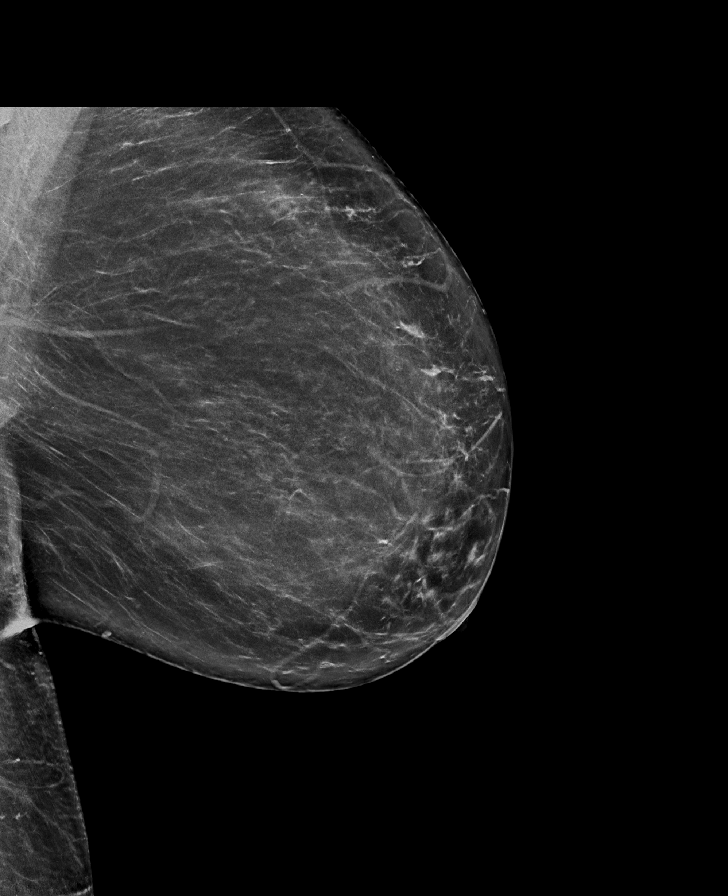

[R MLO synth-2D]
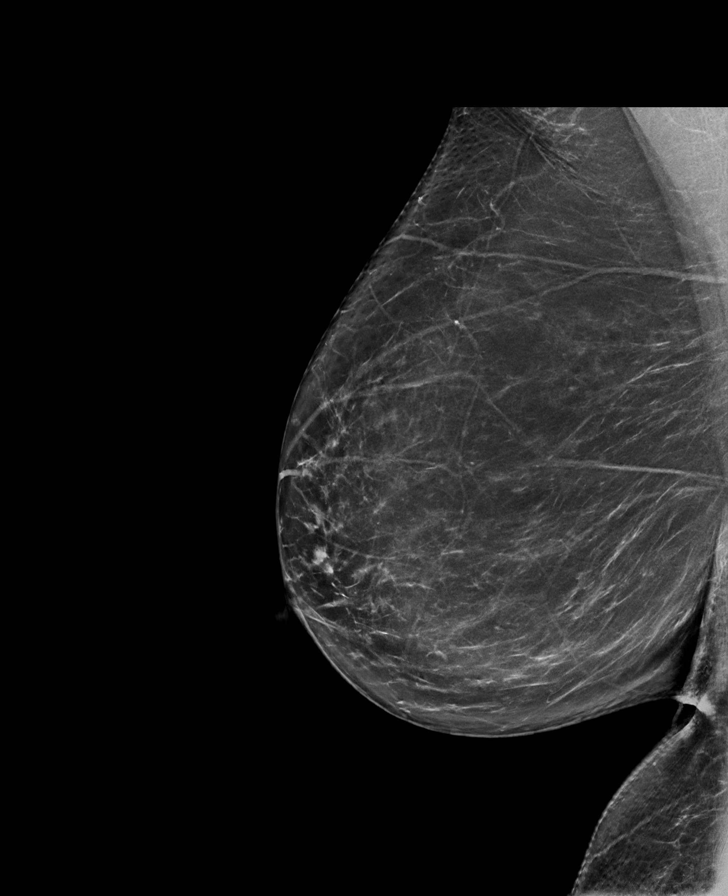

[R CC synth-2D]
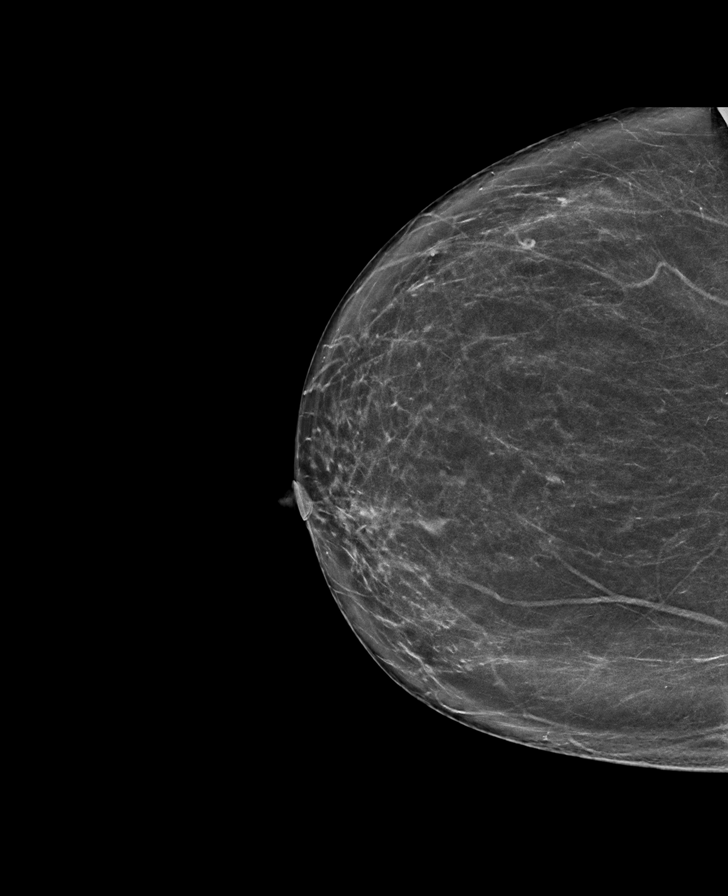

[L MLO tomo · tomo slice 39/78.0]
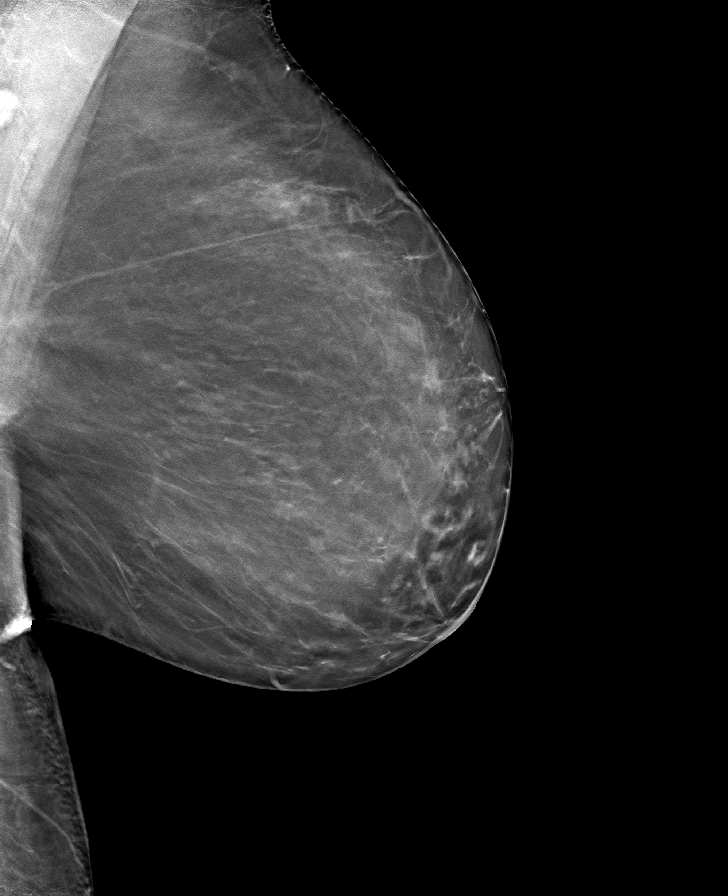

[R CC tomo · tomo slice 37/72.0]
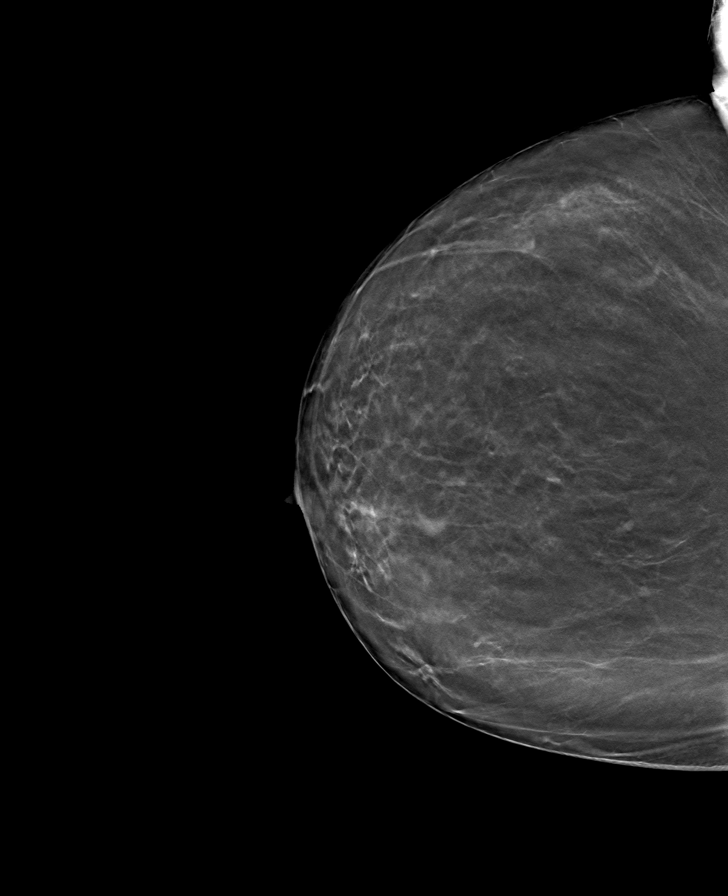

[R MLO tomo · tomo slice 41/82.0]
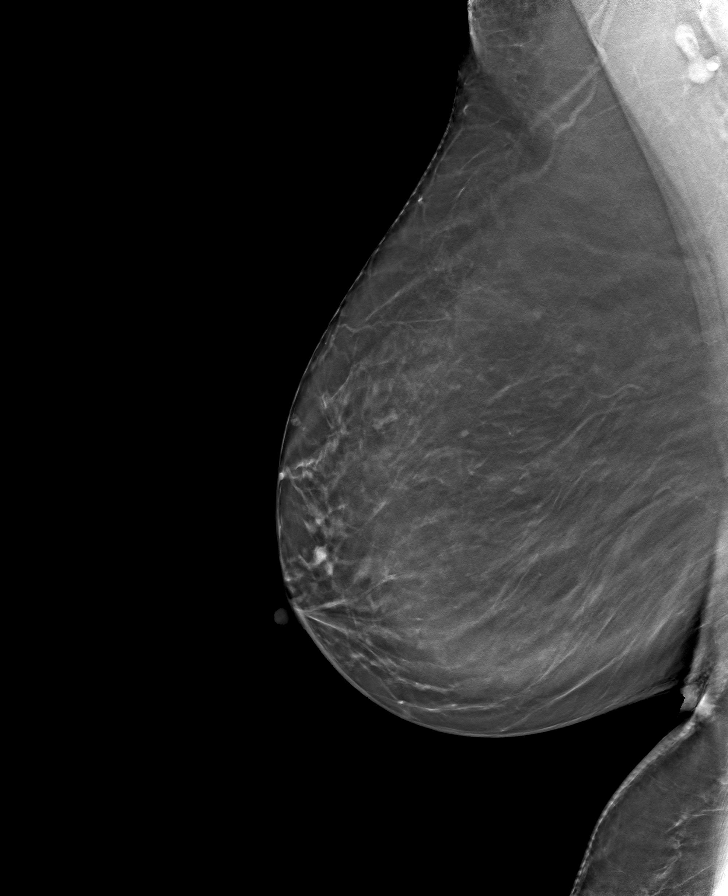

[L CC tomo · tomo slice 36/71.0]
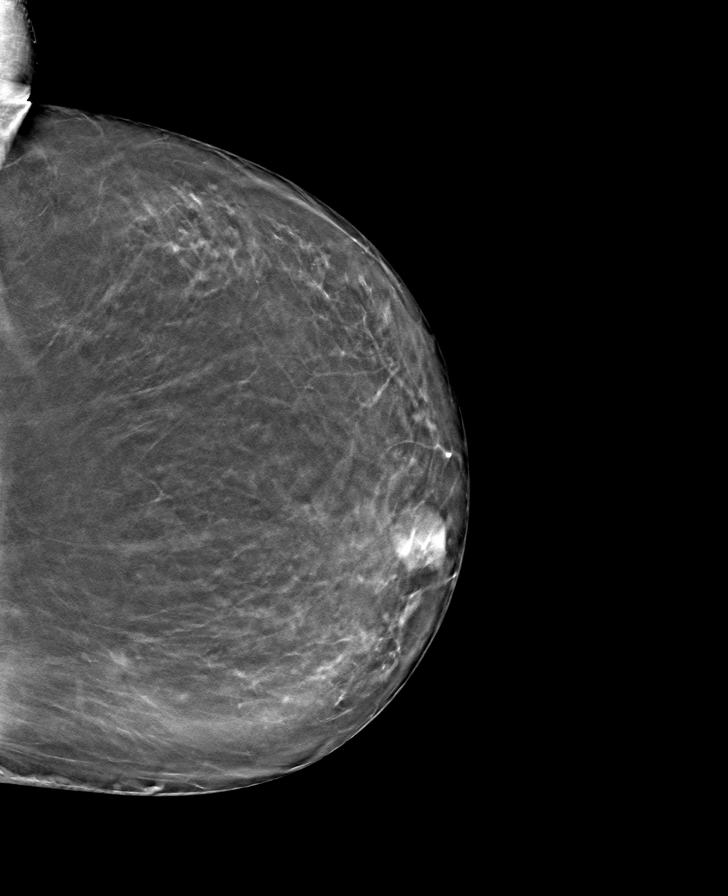

[8 of 24 positions shown; findings below may reference images not displayed]

ACR Breast Density Category b: There are scattered areas of
fibroglandular density.
FINDINGS: There are no findings suspicious for malignancy.
IMPRESSION: No mammographic evidence of malignancy. A result letter of this
screening mammogram will be mailed directly to the patient.

RECOMMENDATION:
Screening mammogram in one year. (Code:51-O-LD2)

BI-RADS CATEGORY  1: Negative.

## 2022-06-04 ENCOUNTER — Ambulatory Visit
Admission: RE | Admit: 2022-06-04 | Discharge: 2022-06-04 | Disposition: A | Discharge status: Home / Self Care | Payer: Medicare Other | Source: Ambulatory Visit | Attending: Family Medicine | Admitting: Family Medicine

## 2022-06-04 ENCOUNTER — Other Ambulatory Visit: Payer: Self-pay | Admitting: Family Medicine

## 2022-06-04 DIAGNOSIS — M545 Low back pain, unspecified: Secondary | ICD-10-CM

## 2022-06-11 ENCOUNTER — Other Ambulatory Visit: Payer: Self-pay | Admitting: Family Medicine

## 2022-06-11 DIAGNOSIS — Z0000 Encounter for general adult medical examination without abnormal findings: Secondary | ICD-10-CM

## 2022-06-27 ENCOUNTER — Ambulatory Visit
Admission: RE | Admit: 2022-06-27 | Discharge: 2022-06-27 | Disposition: A | Discharge status: Home / Self Care | Payer: Medicare Other | Source: Ambulatory Visit | Attending: Family Medicine | Admitting: Family Medicine

## 2022-06-27 DIAGNOSIS — Z0000 Encounter for general adult medical examination without abnormal findings: Secondary | ICD-10-CM

## 2022-07-02 ENCOUNTER — Other Ambulatory Visit: Payer: Self-pay | Admitting: Family Medicine

## 2022-07-02 DIAGNOSIS — R928 Other abnormal and inconclusive findings on diagnostic imaging of breast: Secondary | ICD-10-CM

## 2022-07-09 ENCOUNTER — Ambulatory Visit
Admission: RE | Admit: 2022-07-09 | Discharge: 2022-07-09 | Disposition: A | Discharge status: Home / Self Care | Payer: Medicare Other | Source: Ambulatory Visit | Attending: Family Medicine | Admitting: Family Medicine

## 2022-07-09 DIAGNOSIS — R928 Other abnormal and inconclusive findings on diagnostic imaging of breast: Secondary | ICD-10-CM

## 2023-02-12 ENCOUNTER — Other Ambulatory Visit: Payer: Self-pay | Admitting: Family Medicine

## 2023-02-12 DIAGNOSIS — E2839 Other primary ovarian failure: Secondary | ICD-10-CM

## 2023-02-13 ENCOUNTER — Other Ambulatory Visit: Payer: Self-pay | Admitting: Family Medicine

## 2023-02-13 DIAGNOSIS — M5136 Other intervertebral disc degeneration, lumbar region with discogenic back pain only: Secondary | ICD-10-CM

## 2023-02-25 ENCOUNTER — Ambulatory Visit
Admission: RE | Admit: 2023-02-25 | Discharge: 2023-02-25 | Disposition: A | Discharge status: Home / Self Care | Payer: Medicare Other | Source: Ambulatory Visit | Attending: Family Medicine | Admitting: Family Medicine

## 2023-02-25 ENCOUNTER — Other Ambulatory Visit: Payer: Medicare Other

## 2023-02-25 DIAGNOSIS — M5136 Other intervertebral disc degeneration, lumbar region with discogenic back pain only: Secondary | ICD-10-CM

## 2023-07-03 ENCOUNTER — Other Ambulatory Visit: Payer: Self-pay | Admitting: Family Medicine

## 2023-07-03 ENCOUNTER — Ambulatory Visit
Admission: RE | Admit: 2023-07-03 | Discharge: 2023-07-03 | Disposition: A | Discharge status: Home / Self Care | Source: Ambulatory Visit | Attending: Family Medicine | Admitting: Family Medicine

## 2023-07-03 DIAGNOSIS — R319 Hematuria, unspecified: Secondary | ICD-10-CM

## 2023-07-30 ENCOUNTER — Other Ambulatory Visit: Payer: Self-pay | Admitting: Family Medicine

## 2023-07-30 ENCOUNTER — Ambulatory Visit
Admission: RE | Admit: 2023-07-30 | Discharge: 2023-07-30 | Disposition: A | Discharge status: Home / Self Care | Source: Ambulatory Visit | Attending: Family Medicine | Admitting: Family Medicine

## 2023-07-30 DIAGNOSIS — Z1231 Encounter for screening mammogram for malignant neoplasm of breast: Secondary | ICD-10-CM

## 2023-10-07 ENCOUNTER — Other Ambulatory Visit: Payer: Medicare Other

## 2023-10-07 ENCOUNTER — Ambulatory Visit (HOSPITAL_BASED_OUTPATIENT_CLINIC_OR_DEPARTMENT_OTHER)
Admission: RE | Admit: 2023-10-07 | Discharge: 2023-10-07 | Disposition: A | Discharge status: Home / Self Care | Source: Ambulatory Visit | Attending: Family Medicine | Admitting: Family Medicine

## 2023-10-07 DIAGNOSIS — E2839 Other primary ovarian failure: Secondary | ICD-10-CM | POA: Diagnosis present
